# Patient Record
Sex: Female | Born: 1965 | Race: White | Hispanic: No | Marital: Married | Smoking: Never smoker
Health system: Southern US, Community
[De-identification: ages and names within clinical notes are randomized; demographics above are authoritative.]

## PROBLEM LIST (undated history)

## (undated) DIAGNOSIS — Z86010 Personal history of colonic polyps: Principal | ICD-10-CM

## (undated) DIAGNOSIS — F419 Anxiety disorder, unspecified: Secondary | ICD-10-CM

## (undated) DIAGNOSIS — F32A Depression, unspecified: Secondary | ICD-10-CM

## (undated) DIAGNOSIS — T7840XA Allergy, unspecified, initial encounter: Secondary | ICD-10-CM

## (undated) HISTORY — DX: Allergy, unspecified, initial encounter: T78.40XA

## (undated) HISTORY — DX: Personal history of colonic polyps: Z86.010

## (undated) HISTORY — PX: COLONOSCOPY: SHX174

## (undated) HISTORY — PX: DILATION AND CURETTAGE OF UTERUS: SHX78

## (undated) HISTORY — DX: Depression, unspecified: F32.A

## (undated) HISTORY — DX: Anxiety disorder, unspecified: F41.9

---

## 1998-03-19 ENCOUNTER — Inpatient Hospital Stay (HOSPITAL_COMMUNITY): Admission: AD | Admit: 1998-03-19 | Discharge: 1998-03-19 | Payer: Self-pay | Admitting: Obstetrics and Gynecology

## 1999-05-09 ENCOUNTER — Other Ambulatory Visit: Admission: RE | Admit: 1999-05-09 | Discharge: 1999-05-09 | Payer: Self-pay | Admitting: Obstetrics and Gynecology

## 1999-09-25 ENCOUNTER — Inpatient Hospital Stay (HOSPITAL_COMMUNITY): Admission: AD | Admit: 1999-09-25 | Discharge: 1999-09-25 | Payer: Self-pay | Admitting: Obstetrics and Gynecology

## 1999-11-21 ENCOUNTER — Inpatient Hospital Stay (HOSPITAL_COMMUNITY): Admission: AD | Admit: 1999-11-21 | Discharge: 1999-11-24 | Payer: Self-pay | Admitting: Obstetrics and Gynecology

## 1999-12-26 ENCOUNTER — Other Ambulatory Visit: Admission: RE | Admit: 1999-12-26 | Discharge: 1999-12-26 | Payer: Self-pay | Admitting: Obstetrics and Gynecology

## 2001-02-26 ENCOUNTER — Ambulatory Visit (HOSPITAL_COMMUNITY): Admission: RE | Admit: 2001-02-26 | Discharge: 2001-02-26 | Payer: Self-pay | Admitting: Obstetrics and Gynecology

## 2001-06-17 ENCOUNTER — Ambulatory Visit (HOSPITAL_COMMUNITY): Admission: RE | Admit: 2001-06-17 | Discharge: 2001-06-17 | Payer: Self-pay | Admitting: Obstetrics and Gynecology

## 2001-06-17 ENCOUNTER — Encounter: Payer: Self-pay | Admitting: Obstetrics and Gynecology

## 2001-12-21 ENCOUNTER — Other Ambulatory Visit: Admission: RE | Admit: 2001-12-21 | Discharge: 2001-12-21 | Payer: Self-pay | Admitting: Obstetrics and Gynecology

## 2002-07-02 ENCOUNTER — Inpatient Hospital Stay (HOSPITAL_COMMUNITY): Admission: AD | Admit: 2002-07-02 | Discharge: 2002-07-02 | Payer: Self-pay | Admitting: Obstetrics and Gynecology

## 2002-07-08 ENCOUNTER — Inpatient Hospital Stay (HOSPITAL_COMMUNITY): Admission: AD | Admit: 2002-07-08 | Discharge: 2002-07-11 | Payer: Self-pay | Admitting: Obstetrics and Gynecology

## 2002-08-18 ENCOUNTER — Other Ambulatory Visit: Admission: RE | Admit: 2002-08-18 | Discharge: 2002-08-18 | Payer: Self-pay | Admitting: Obstetrics and Gynecology

## 2003-09-21 ENCOUNTER — Other Ambulatory Visit: Admission: RE | Admit: 2003-09-21 | Discharge: 2003-09-21 | Payer: Self-pay | Admitting: Obstetrics and Gynecology

## 2004-11-12 ENCOUNTER — Other Ambulatory Visit: Admission: RE | Admit: 2004-11-12 | Discharge: 2004-11-12 | Payer: Self-pay | Admitting: Obstetrics and Gynecology

## 2005-11-28 ENCOUNTER — Other Ambulatory Visit: Admission: RE | Admit: 2005-11-28 | Discharge: 2005-11-28 | Payer: Self-pay | Admitting: Obstetrics and Gynecology

## 2014-06-27 ENCOUNTER — Other Ambulatory Visit: Payer: Self-pay | Admitting: Obstetrics and Gynecology

## 2014-06-27 DIAGNOSIS — R928 Other abnormal and inconclusive findings on diagnostic imaging of breast: Secondary | ICD-10-CM

## 2014-07-05 ENCOUNTER — Ambulatory Visit
Admission: RE | Admit: 2014-07-05 | Discharge: 2014-07-05 | Disposition: A | Discharge status: Home / Self Care | Payer: PRIVATE HEALTH INSURANCE | Source: Ambulatory Visit | Attending: Obstetrics and Gynecology | Admitting: Obstetrics and Gynecology

## 2014-07-05 ENCOUNTER — Encounter (INDEPENDENT_AMBULATORY_CARE_PROVIDER_SITE_OTHER): Payer: Self-pay

## 2014-07-05 DIAGNOSIS — R928 Other abnormal and inconclusive findings on diagnostic imaging of breast: Secondary | ICD-10-CM

## 2015-02-19 ENCOUNTER — Other Ambulatory Visit: Payer: Self-pay | Admitting: Obstetrics and Gynecology

## 2015-02-19 ENCOUNTER — Encounter: Payer: Self-pay | Admitting: Internal Medicine

## 2015-02-20 LAB — CYTOLOGY - PAP

## 2015-02-26 ENCOUNTER — Encounter: Payer: Self-pay | Admitting: Internal Medicine

## 2015-04-17 ENCOUNTER — Ambulatory Visit (AMBULATORY_SURGERY_CENTER): Payer: Self-pay | Admitting: *Deleted

## 2015-04-17 VITALS — Ht 68.0 in | Wt 126.0 lb

## 2015-04-17 DIAGNOSIS — Z1211 Encounter for screening for malignant neoplasm of colon: Secondary | ICD-10-CM

## 2015-04-17 NOTE — Progress Notes (Signed)
No egg or soy allergy. No anesthesia problems.  No home o2.  No diet meds.  

## 2015-05-01 ENCOUNTER — Encounter: Payer: PRIVATE HEALTH INSURANCE | Admitting: Internal Medicine

## 2015-05-02 ENCOUNTER — Ambulatory Visit: Payer: PRIVATE HEALTH INSURANCE | Admitting: Internal Medicine

## 2015-05-17 ENCOUNTER — Ambulatory Visit (AMBULATORY_SURGERY_CENTER): Payer: PRIVATE HEALTH INSURANCE | Admitting: Internal Medicine

## 2015-05-17 ENCOUNTER — Encounter: Payer: Self-pay | Admitting: Internal Medicine

## 2015-05-17 VITALS — BP 123/80 | HR 81 | Temp 98.5°F | Resp 17 | Ht 68.0 in | Wt 126.0 lb

## 2015-05-17 DIAGNOSIS — D122 Benign neoplasm of ascending colon: Secondary | ICD-10-CM | POA: Diagnosis not present

## 2015-05-17 DIAGNOSIS — Z1211 Encounter for screening for malignant neoplasm of colon: Secondary | ICD-10-CM | POA: Diagnosis not present

## 2015-05-17 MED ORDER — SODIUM CHLORIDE 0.9 % IV SOLN: 500.0000 mL | INTRAVENOUS | Status: DC

## 2015-05-17 NOTE — Patient Instructions (Addendum)
I found a 2 mm polyp and removed it.  All else normal.  I appreciate the opportunity to care for you. Gatha Mayer, MD, FACG   YOU HAD AN ENDOSCOPIC PROCEDURE TODAY AT [facility]:   Refer to the procedure report that was given to you for any specific questions about what was found during the examination.  If the procedure report does not answer your questions, please call your gastroenterologist to clarify.  If you requested that your care partner not be given the details of your procedure findings, then the procedure report has been included in a sealed envelope for you to review at your convenience later.  YOU SHOULD EXPECT: Some feelings of bloating in the abdomen. Passage of more gas than usual.  Walking can help get rid of the air that was put into your GI tract during the procedure and reduce the bloating. If you had a lower endoscopy (such as a colonoscopy or flexible sigmoidoscopy) you may notice spotting of blood in your stool or on the toilet paper. If you underwent a bowel prep for your procedure, you may not have a normal bowel movement for a few days.  Please Note:  You might notice some irritation and congestion in your nose or some drainage.  This is from the oxygen used during your procedure.  There is no need for concern and it should clear up in a day or so.  SYMPTOMS TO REPORT IMMEDIATELY:   Following lower endoscopy (colonoscopy or flexible sigmoidoscopy):  Excessive amounts of blood in the stool  Significant tenderness or worsening of abdominal pains  Swelling of the abdomen that is new, acute  Fever of 100F or higher    For urgent or emergent issues, a gastroenterologist can be reached at any hour by calling [phone].   DIET: Your first meal following the procedure should be a small meal and then it is ok to progress to your normal diet. Heavy or fried foods are harder to digest and may make you feel nauseous or bloated.   Likewise, meals heavy in dairy and vegetables can increase bloating.  Drink plenty of fluids but you should avoid alcoholic beverages for 24 hours.  ACTIVITY:  You should plan to take it easy for the rest of today and you should NOT DRIVE or use heavy machinery until tomorrow (because of the sedation medicines used during the test).    FOLLOW UP: Our staff will call the number listed on your records the next business day following your procedure to check on you and address any questions or concerns that you may have regarding the information given to you following your procedure. If we do not reach you, we will leave a message.  However, if you are feeling well and you are not experiencing any problems, there is no need to return our call.  We will assume that you have returned to your regular daily activities without incident.  If any biopsies were taken you will be contacted by phone or by letter within the next 1-3 weeks.  Please call us at [phone] if you have not heard about the biopsies in 3 weeks.    SIGNATURES/CONFIDENTIALITY: You and/or your care partner have signed paperwork which will be entered into your electronic medical record.  These signatures attest to the fact that that the information above on your After Visit Summary has been reviewed and is understood.  Full responsibility of the confidentiality of this discharge information  lies with you and/or your care-partner.   INFORMATION ON POLYPS GIVEN TO YOU TODAY

## 2015-05-17 NOTE — Progress Notes (Signed)
Transferred to recovery room. A/O x3, pleased with MAC.  VSS.  Report to Penny, RN. 

## 2015-05-17 NOTE — Progress Notes (Signed)
Called to room to assist during endoscopic procedure.  Patient ID and intended procedure confirmed with present staff. Received instructions for my participation in the procedure from the performing physician.  

## 2015-05-17 NOTE — Op Note (Signed)
[place]  [address]. [place], [address]   COLONOSCOPY PROCEDURE REPORT  PATIENT: Diane Stewart, Diane Stewart  MR#: [id number] BIRTHDATE: 12-31-1965 , 49  yrs. old GENDER: female ENDOSCOPIST: Gatha Mayer, MD, [facility] PROCEDURE DATE:  05/17/2015 PROCEDURE:   Colonoscopy, screening and Colonoscopy with biopsy First Screening Colonoscopy - Avg.  risk and is 50 yrs.  old or older Yes.  Prior Negative Screening - Now for repeat screening. N/A  History of Adenoma - Now for follow-up colonoscopy & has been > or = to 3 yrs.  N/A  Polyps removed today? Yes ASA CLASS:   Class I INDICATIONS:Screening for colonic neoplasia and Colorectal Neoplasm Risk Assessment for this procedure is average risk. MEDICATIONS: Propofol 300 mg IV and Monitored anesthesia care  DESCRIPTION OF PROCEDURE:   After the risks benefits and alternatives of the procedure were thoroughly explained, informed consent was obtained.  The digital rectal exam revealed no abnormalities of the rectum.   The LB [id number]  endoscope was introduced through the anus and advanced to the cecum, which was identified by both the appendix and ileocecal valve. No adverse events experienced.   The quality of the prep was excellent. (MiraLax was used)  The instrument was then slowly withdrawn as the colon was fully examined. Estimated blood loss is zero unless otherwise noted in this procedure report.      COLON FINDINGS: A sessile polyp measuring 2 mm in size was found in the ascending colon.  A polypectomy was performed with cold forceps.  The resection was complete, the polyp tissue was completely retrieved and sent to histology.   The examination was otherwise normal.  Retroflexed views revealed no abnormalities. The time to cecum = 7.7 Withdrawal time = 8.1   The scope was withdrawn and the procedure completed. COMPLICATIONS: There were no immediate complications.  ENDOSCOPIC IMPRESSION: 1.   Sessile polyp  was found in the ascending colon; polypectomy was performed with cold forceps 2.   The examination was otherwise normal - excellent prep - first screening  RECOMMENDATIONS: Timing of repeat colonoscopy will be determined by pathology findings.  eSigned:  Gatha Mayer, MD, [facility] 05/17/2015 10:37 AM   cc: Dr. Arvella Nigh and The Patient

## 2015-05-18 ENCOUNTER — Telehealth: Payer: Self-pay | Admitting: *Deleted

## 2015-05-18 NOTE — Telephone Encounter (Signed)
  Follow up Call-  Call back number 05/17/2015  Post procedure Call Back phone  # [phone]  Permission to leave phone message Yes     Patient questions:  Do you have a fever, pain , or abdominal swelling? No. Pain Score  0 *  Have you tolerated food without any problems? Yes.    Have you been able to return to your normal activities? Yes.    Do you have any questions about your discharge instructions: Diet   No. Medications  No. Follow up visit  No.  Do you have questions or concerns about your Care? No.  Actions: * If pain score is 4 or above: No action needed, pain <4.

## 2015-05-23 ENCOUNTER — Other Ambulatory Visit: Payer: Self-pay

## 2015-05-23 DIAGNOSIS — Z1231 Encounter for screening mammogram for malignant neoplasm of breast: Secondary | ICD-10-CM

## 2015-05-27 ENCOUNTER — Encounter: Payer: Self-pay | Admitting: Internal Medicine

## 2015-05-27 DIAGNOSIS — Z86010 Personal history of colonic polyps: Secondary | ICD-10-CM

## 2015-05-27 DIAGNOSIS — Z860101 Personal history of adenomatous and serrated colon polyps: Secondary | ICD-10-CM

## 2015-05-27 HISTORY — DX: Personal history of adenomatous and serrated colon polyps: Z86.0101

## 2015-05-27 HISTORY — DX: Personal history of colonic polyps: Z86.010

## 2015-05-27 NOTE — Progress Notes (Signed)
Quick Note:  2 mm adenoma Colon recall 5-55yrs (2021-23) ______

## 2015-07-10 ENCOUNTER — Ambulatory Visit
Admission: RE | Admit: 2015-07-10 | Discharge: 2015-07-10 | Disposition: A | Discharge status: Home / Self Care | Payer: PRIVATE HEALTH INSURANCE | Source: Ambulatory Visit

## 2015-07-10 DIAGNOSIS — Z1231 Encounter for screening mammogram for malignant neoplasm of breast: Secondary | ICD-10-CM

## 2016-06-16 ENCOUNTER — Other Ambulatory Visit: Payer: Self-pay | Admitting: Obstetrics and Gynecology

## 2016-06-16 DIAGNOSIS — Z1231 Encounter for screening mammogram for malignant neoplasm of breast: Secondary | ICD-10-CM

## 2016-07-21 ENCOUNTER — Ambulatory Visit
Admission: RE | Admit: 2016-07-21 | Discharge: 2016-07-21 | Disposition: A | Discharge status: Home / Self Care | Payer: PRIVATE HEALTH INSURANCE | Source: Ambulatory Visit | Attending: Obstetrics and Gynecology | Admitting: Obstetrics and Gynecology

## 2016-07-21 DIAGNOSIS — Z1231 Encounter for screening mammogram for malignant neoplasm of breast: Secondary | ICD-10-CM

## 2016-10-24 DIAGNOSIS — H6062 Unspecified chronic otitis externa, left ear: Secondary | ICD-10-CM | POA: Diagnosis not present

## 2016-10-24 DIAGNOSIS — L409 Psoriasis, unspecified: Secondary | ICD-10-CM | POA: Diagnosis not present

## 2016-12-23 ENCOUNTER — Ambulatory Visit (INDEPENDENT_AMBULATORY_CARE_PROVIDER_SITE_OTHER): Payer: 59 | Admitting: Licensed Clinical Social Worker

## 2016-12-23 DIAGNOSIS — F4323 Adjustment disorder with mixed anxiety and depressed mood: Secondary | ICD-10-CM | POA: Diagnosis not present

## 2017-01-12 ENCOUNTER — Ambulatory Visit (INDEPENDENT_AMBULATORY_CARE_PROVIDER_SITE_OTHER): Payer: 59 | Admitting: Licensed Clinical Social Worker

## 2017-01-12 DIAGNOSIS — F4323 Adjustment disorder with mixed anxiety and depressed mood: Secondary | ICD-10-CM

## 2017-01-20 ENCOUNTER — Ambulatory Visit (INDEPENDENT_AMBULATORY_CARE_PROVIDER_SITE_OTHER): Payer: 59 | Admitting: Licensed Clinical Social Worker

## 2017-01-20 DIAGNOSIS — F4323 Adjustment disorder with mixed anxiety and depressed mood: Secondary | ICD-10-CM

## 2017-02-16 ENCOUNTER — Ambulatory Visit (INDEPENDENT_AMBULATORY_CARE_PROVIDER_SITE_OTHER): Payer: 59 | Admitting: Licensed Clinical Social Worker

## 2017-02-16 DIAGNOSIS — F419 Anxiety disorder, unspecified: Secondary | ICD-10-CM | POA: Diagnosis not present

## 2017-02-23 ENCOUNTER — Ambulatory Visit (INDEPENDENT_AMBULATORY_CARE_PROVIDER_SITE_OTHER): Payer: 59 | Admitting: Licensed Clinical Social Worker

## 2017-02-23 DIAGNOSIS — F419 Anxiety disorder, unspecified: Secondary | ICD-10-CM | POA: Diagnosis not present

## 2017-03-02 DIAGNOSIS — Z681 Body mass index (BMI) 19 or less, adult: Secondary | ICD-10-CM | POA: Diagnosis not present

## 2017-03-02 DIAGNOSIS — Z01419 Encounter for gynecological examination (general) (routine) without abnormal findings: Secondary | ICD-10-CM | POA: Diagnosis not present

## 2017-03-30 ENCOUNTER — Ambulatory Visit (INDEPENDENT_AMBULATORY_CARE_PROVIDER_SITE_OTHER): Payer: 59 | Admitting: Licensed Clinical Social Worker

## 2017-03-30 DIAGNOSIS — F419 Anxiety disorder, unspecified: Secondary | ICD-10-CM | POA: Diagnosis not present

## 2017-05-07 ENCOUNTER — Ambulatory Visit (INDEPENDENT_AMBULATORY_CARE_PROVIDER_SITE_OTHER): Payer: 59 | Admitting: Licensed Clinical Social Worker

## 2017-05-07 DIAGNOSIS — F419 Anxiety disorder, unspecified: Secondary | ICD-10-CM

## 2017-05-27 ENCOUNTER — Ambulatory Visit (INDEPENDENT_AMBULATORY_CARE_PROVIDER_SITE_OTHER): Payer: 59 | Admitting: Licensed Clinical Social Worker

## 2017-05-27 DIAGNOSIS — F419 Anxiety disorder, unspecified: Secondary | ICD-10-CM | POA: Diagnosis not present

## 2017-06-10 DIAGNOSIS — E559 Vitamin D deficiency, unspecified: Secondary | ICD-10-CM | POA: Diagnosis not present

## 2017-06-10 DIAGNOSIS — Z1321 Encounter for screening for nutritional disorder: Secondary | ICD-10-CM | POA: Diagnosis not present

## 2017-06-10 DIAGNOSIS — Z13228 Encounter for screening for other metabolic disorders: Secondary | ICD-10-CM | POA: Diagnosis not present

## 2017-06-10 DIAGNOSIS — Z1329 Encounter for screening for other suspected endocrine disorder: Secondary | ICD-10-CM | POA: Diagnosis not present

## 2017-06-10 DIAGNOSIS — Z13220 Encounter for screening for lipoid disorders: Secondary | ICD-10-CM | POA: Diagnosis not present

## 2017-06-10 DIAGNOSIS — Z130 Encounter for screening for diseases of the blood and blood-forming organs and certain disorders involving the immune mechanism: Secondary | ICD-10-CM | POA: Diagnosis not present

## 2017-06-15 ENCOUNTER — Other Ambulatory Visit: Payer: Self-pay | Admitting: Obstetrics and Gynecology

## 2017-06-15 DIAGNOSIS — Z1231 Encounter for screening mammogram for malignant neoplasm of breast: Secondary | ICD-10-CM

## 2017-07-16 ENCOUNTER — Ambulatory Visit (INDEPENDENT_AMBULATORY_CARE_PROVIDER_SITE_OTHER): Payer: 59 | Admitting: Licensed Clinical Social Worker

## 2017-07-16 DIAGNOSIS — F419 Anxiety disorder, unspecified: Secondary | ICD-10-CM

## 2017-07-23 ENCOUNTER — Ambulatory Visit
Admission: RE | Admit: 2017-07-23 | Discharge: 2017-07-23 | Disposition: A | Discharge status: Home / Self Care | Payer: 59 | Source: Ambulatory Visit | Attending: Obstetrics and Gynecology | Admitting: Obstetrics and Gynecology

## 2017-07-23 DIAGNOSIS — Z1231 Encounter for screening mammogram for malignant neoplasm of breast: Secondary | ICD-10-CM

## 2017-08-10 ENCOUNTER — Ambulatory Visit: Payer: 59 | Admitting: Licensed Clinical Social Worker

## 2017-10-07 DIAGNOSIS — E559 Vitamin D deficiency, unspecified: Secondary | ICD-10-CM | POA: Diagnosis not present

## 2017-11-18 DIAGNOSIS — L400 Psoriasis vulgaris: Secondary | ICD-10-CM | POA: Diagnosis not present

## 2017-11-18 DIAGNOSIS — D224 Melanocytic nevi of scalp and neck: Secondary | ICD-10-CM | POA: Diagnosis not present

## 2017-11-18 DIAGNOSIS — D2272 Melanocytic nevi of left lower limb, including hip: Secondary | ICD-10-CM | POA: Diagnosis not present

## 2017-11-18 DIAGNOSIS — D1801 Hemangioma of skin and subcutaneous tissue: Secondary | ICD-10-CM | POA: Diagnosis not present

## 2017-11-18 DIAGNOSIS — D2271 Melanocytic nevi of right lower limb, including hip: Secondary | ICD-10-CM | POA: Diagnosis not present

## 2017-11-18 DIAGNOSIS — C44519 Basal cell carcinoma of skin of other part of trunk: Secondary | ICD-10-CM | POA: Diagnosis not present

## 2017-11-18 DIAGNOSIS — L820 Inflamed seborrheic keratosis: Secondary | ICD-10-CM | POA: Diagnosis not present

## 2017-11-18 DIAGNOSIS — D225 Melanocytic nevi of trunk: Secondary | ICD-10-CM | POA: Diagnosis not present

## 2017-11-18 DIAGNOSIS — D2261 Melanocytic nevi of right upper limb, including shoulder: Secondary | ICD-10-CM | POA: Diagnosis not present

## 2017-11-18 DIAGNOSIS — D485 Neoplasm of uncertain behavior of skin: Secondary | ICD-10-CM | POA: Diagnosis not present

## 2017-11-18 DIAGNOSIS — L821 Other seborrheic keratosis: Secondary | ICD-10-CM | POA: Diagnosis not present

## 2018-01-11 DIAGNOSIS — H524 Presbyopia: Secondary | ICD-10-CM | POA: Diagnosis not present

## 2018-03-03 DIAGNOSIS — Z01419 Encounter for gynecological examination (general) (routine) without abnormal findings: Secondary | ICD-10-CM | POA: Diagnosis not present

## 2018-03-03 DIAGNOSIS — Z681 Body mass index (BMI) 19 or less, adult: Secondary | ICD-10-CM | POA: Diagnosis not present

## 2018-06-15 ENCOUNTER — Other Ambulatory Visit: Payer: Self-pay | Admitting: Obstetrics and Gynecology

## 2018-06-15 DIAGNOSIS — Z1231 Encounter for screening mammogram for malignant neoplasm of breast: Secondary | ICD-10-CM

## 2018-06-25 ENCOUNTER — Ambulatory Visit: Payer: Self-pay | Admitting: Nurse Practitioner

## 2018-06-25 DIAGNOSIS — Z23 Encounter for immunization: Secondary | ICD-10-CM

## 2018-07-26 ENCOUNTER — Ambulatory Visit
Admission: RE | Admit: 2018-07-26 | Discharge: 2018-07-26 | Disposition: A | Discharge status: Home / Self Care | Payer: 59 | Source: Ambulatory Visit | Attending: Obstetrics and Gynecology | Admitting: Obstetrics and Gynecology

## 2018-07-26 DIAGNOSIS — Z1231 Encounter for screening mammogram for malignant neoplasm of breast: Secondary | ICD-10-CM | POA: Diagnosis not present

## 2018-11-22 DIAGNOSIS — D2272 Melanocytic nevi of left lower limb, including hip: Secondary | ICD-10-CM | POA: Diagnosis not present

## 2018-11-22 DIAGNOSIS — D2261 Melanocytic nevi of right upper limb, including shoulder: Secondary | ICD-10-CM | POA: Diagnosis not present

## 2018-11-22 DIAGNOSIS — D225 Melanocytic nevi of trunk: Secondary | ICD-10-CM | POA: Diagnosis not present

## 2018-11-22 DIAGNOSIS — D2262 Melanocytic nevi of left upper limb, including shoulder: Secondary | ICD-10-CM | POA: Diagnosis not present

## 2018-11-22 DIAGNOSIS — D2271 Melanocytic nevi of right lower limb, including hip: Secondary | ICD-10-CM | POA: Diagnosis not present

## 2018-11-22 DIAGNOSIS — L400 Psoriasis vulgaris: Secondary | ICD-10-CM | POA: Diagnosis not present

## 2018-11-22 DIAGNOSIS — D2371 Other benign neoplasm of skin of right lower limb, including hip: Secondary | ICD-10-CM | POA: Diagnosis not present

## 2018-11-22 MED FILL — TRIAMCINOLONE 0.025% CREAM: 0.025 | 10 days supply | Qty: 15 | Fill #0

## 2019-05-05 DIAGNOSIS — Z681 Body mass index (BMI) 19 or less, adult: Secondary | ICD-10-CM | POA: Diagnosis not present

## 2019-05-05 DIAGNOSIS — Z01419 Encounter for gynecological examination (general) (routine) without abnormal findings: Secondary | ICD-10-CM | POA: Diagnosis not present

## 2019-06-22 DIAGNOSIS — Z23 Encounter for immunization: Secondary | ICD-10-CM | POA: Diagnosis not present

## 2019-06-23 ENCOUNTER — Other Ambulatory Visit: Payer: Self-pay | Admitting: Obstetrics and Gynecology

## 2019-06-23 DIAGNOSIS — Z1231 Encounter for screening mammogram for malignant neoplasm of breast: Secondary | ICD-10-CM

## 2019-08-01 ENCOUNTER — Ambulatory Visit: Payer: 59

## 2019-10-13 DIAGNOSIS — N390 Urinary tract infection, site not specified: Secondary | ICD-10-CM | POA: Diagnosis not present

## 2019-10-13 DIAGNOSIS — R309 Painful micturition, unspecified: Secondary | ICD-10-CM | POA: Diagnosis not present

## 2019-10-13 DIAGNOSIS — R3915 Urgency of urination: Secondary | ICD-10-CM | POA: Diagnosis not present

## 2019-10-13 MED FILL — NITROFURANTOIN MONO-MCR 100: 100 | 5 days supply | Qty: 10 | Fill #0

## 2019-12-23 ENCOUNTER — Ambulatory Visit: Payer: 59 | Attending: Internal Medicine

## 2019-12-23 DIAGNOSIS — Z23 Encounter for immunization: Secondary | ICD-10-CM

## 2019-12-23 NOTE — Progress Notes (Signed)
   Covid-19 Vaccination Clinic  Name:  Diane Stewart    MRN: [id number] DOB: [birth year]  12/23/2019  [name] was observed post Covid-19 immunization for 15 minutes without incident. She was provided with Vaccine Information Sheet and instruction to access the V-Safe system.   [name] was instructed to call 911 with any severe reactions post vaccine: Marland Kitchen Difficulty breathing  . Swelling of face and throat  . A fast heartbeat  . A bad rash all over body  . Dizziness and weakness   Immunizations Administered    Name Date Dose VIS Date Route   Pfizer COVID-19 Vaccine 12/23/2019  3:56 PM 0.3 mL 08/26/2019 Intramuscular   Manufacturer: [place]   Lot: [id number]   [place]: [id number]

## 2020-01-18 ENCOUNTER — Ambulatory Visit: Payer: 59 | Attending: Internal Medicine

## 2020-01-18 DIAGNOSIS — Z23 Encounter for immunization: Secondary | ICD-10-CM

## 2020-01-18 NOTE — Progress Notes (Signed)
   Covid-19 Vaccination Clinic  Name:  Diane Stewart    MRN: [id number] DOB: 1966/04/27  01/18/2020  Diane Stewart was observed post Covid-19 immunization for 15 minutes without incident. She was provided with Vaccine Information Sheet and instruction to access the V-Safe system.   Diane Stewart was instructed to call 911 with any severe reactions post vaccine: Marland Kitchen Difficulty breathing  . Swelling of face and throat  . A fast heartbeat  . A bad rash all over body  . Dizziness and weakness   Immunizations Administered    Name Date Dose VIS Date Route   Pfizer COVID-19 Vaccine 01/18/2020 12:26 PM 0.3 mL 11/09/2018 Intramuscular   Manufacturer: [place]   Lot: [id number]   [place]: [id number]

## 2020-02-08 DIAGNOSIS — H52203 Unspecified astigmatism, bilateral: Secondary | ICD-10-CM | POA: Diagnosis not present

## 2020-02-08 DIAGNOSIS — H524 Presbyopia: Secondary | ICD-10-CM | POA: Diagnosis not present

## 2020-03-01 ENCOUNTER — Other Ambulatory Visit: Payer: Self-pay

## 2020-03-01 ENCOUNTER — Ambulatory Visit
Admission: RE | Admit: 2020-03-01 | Discharge: 2020-03-01 | Disposition: A | Discharge status: Home / Self Care | Payer: 59 | Source: Ambulatory Visit | Attending: Obstetrics and Gynecology | Admitting: Obstetrics and Gynecology

## 2020-03-01 DIAGNOSIS — Z1231 Encounter for screening mammogram for malignant neoplasm of breast: Secondary | ICD-10-CM | POA: Diagnosis not present

## 2020-06-06 DIAGNOSIS — D2261 Melanocytic nevi of right upper limb, including shoulder: Secondary | ICD-10-CM | POA: Diagnosis not present

## 2020-06-06 DIAGNOSIS — L400 Psoriasis vulgaris: Secondary | ICD-10-CM | POA: Diagnosis not present

## 2020-06-06 DIAGNOSIS — L720 Epidermal cyst: Secondary | ICD-10-CM | POA: Diagnosis not present

## 2020-06-06 DIAGNOSIS — L821 Other seborrheic keratosis: Secondary | ICD-10-CM | POA: Diagnosis not present

## 2020-06-06 DIAGNOSIS — Z85828 Personal history of other malignant neoplasm of skin: Secondary | ICD-10-CM | POA: Diagnosis not present

## 2020-06-06 DIAGNOSIS — D2372 Other benign neoplasm of skin of left lower limb, including hip: Secondary | ICD-10-CM | POA: Diagnosis not present

## 2020-06-06 DIAGNOSIS — D225 Melanocytic nevi of trunk: Secondary | ICD-10-CM | POA: Diagnosis not present

## 2020-06-06 DIAGNOSIS — L438 Other lichen planus: Secondary | ICD-10-CM | POA: Diagnosis not present

## 2020-06-06 DIAGNOSIS — D2272 Melanocytic nevi of left lower limb, including hip: Secondary | ICD-10-CM | POA: Diagnosis not present

## 2020-06-11 ENCOUNTER — Encounter: Payer: Self-pay | Admitting: Internal Medicine

## 2020-07-09 DIAGNOSIS — Z20822 Contact with and (suspected) exposure to covid-19: Secondary | ICD-10-CM | POA: Diagnosis not present

## 2020-09-11 DIAGNOSIS — R87615 Unsatisfactory cytologic smear of cervix: Secondary | ICD-10-CM | POA: Diagnosis not present

## 2020-09-11 DIAGNOSIS — Z13820 Encounter for screening for osteoporosis: Secondary | ICD-10-CM | POA: Diagnosis not present

## 2020-09-11 DIAGNOSIS — Z681 Body mass index (BMI) 19 or less, adult: Secondary | ICD-10-CM | POA: Diagnosis not present

## 2020-09-11 DIAGNOSIS — Z01419 Encounter for gynecological examination (general) (routine) without abnormal findings: Secondary | ICD-10-CM | POA: Diagnosis not present

## 2020-10-16 DIAGNOSIS — Z01419 Encounter for gynecological examination (general) (routine) without abnormal findings: Secondary | ICD-10-CM | POA: Diagnosis not present

## 2020-10-24 DIAGNOSIS — Z13220 Encounter for screening for lipoid disorders: Secondary | ICD-10-CM | POA: Diagnosis not present

## 2020-10-24 DIAGNOSIS — Z13228 Encounter for screening for other metabolic disorders: Secondary | ICD-10-CM | POA: Diagnosis not present

## 2020-10-24 DIAGNOSIS — Z1329 Encounter for screening for other suspected endocrine disorder: Secondary | ICD-10-CM | POA: Diagnosis not present

## 2020-10-24 DIAGNOSIS — N83202 Unspecified ovarian cyst, left side: Secondary | ICD-10-CM | POA: Diagnosis not present

## 2020-10-24 DIAGNOSIS — Z1321 Encounter for screening for nutritional disorder: Secondary | ICD-10-CM | POA: Diagnosis not present

## 2020-12-10 DIAGNOSIS — E559 Vitamin D deficiency, unspecified: Secondary | ICD-10-CM | POA: Diagnosis not present

## 2021-01-24 ENCOUNTER — Other Ambulatory Visit: Payer: Self-pay | Admitting: Obstetrics and Gynecology

## 2021-01-24 DIAGNOSIS — Z1231 Encounter for screening mammogram for malignant neoplasm of breast: Secondary | ICD-10-CM

## 2021-03-22 ENCOUNTER — Ambulatory Visit
Admission: RE | Admit: 2021-03-22 | Discharge: 2021-03-22 | Disposition: A | Discharge status: Home / Self Care | Payer: 59 | Source: Ambulatory Visit | Attending: Obstetrics and Gynecology | Admitting: Obstetrics and Gynecology

## 2021-03-22 ENCOUNTER — Other Ambulatory Visit: Payer: Self-pay

## 2021-03-22 DIAGNOSIS — Z1231 Encounter for screening mammogram for malignant neoplasm of breast: Secondary | ICD-10-CM

## 2021-09-24 IMAGING — MG MM DIGITAL SCREENING BILAT W/ TOMO AND CAD
8 series · 9 of 24 positions shown · non-contrast
Comparison: Previous exam(s).

CLINICAL DATA: Screening.

EXAM:
DIGITAL SCREENING BILATERAL MAMMOGRAM WITH TOMOSYNTHESIS AND CAD
TECHNIQUE: Bilateral screening digital craniocaudal and mediolateral oblique
mammograms were obtained. Bilateral screening digital breast
tomosynthesis was performed. The images were evaluated with
computer-aided detection.

[L MLO synth-2D]
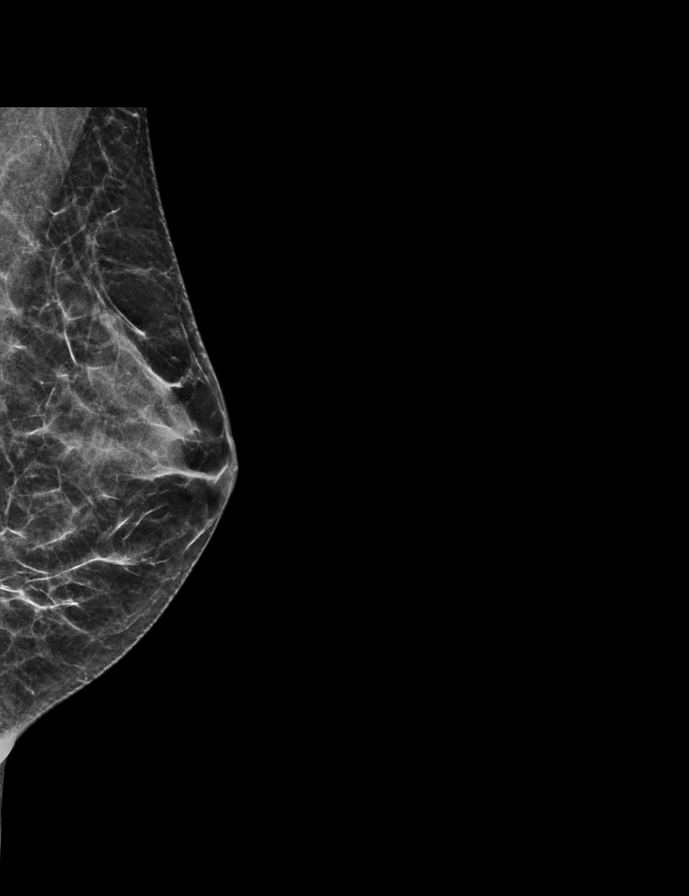

[R CC synth-2D]
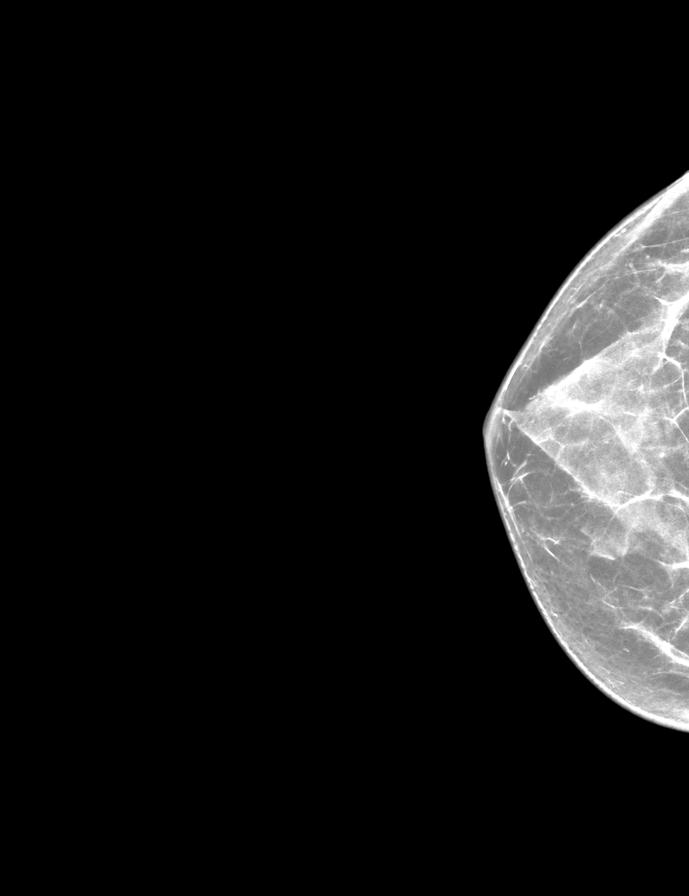

[R MLO synth-2D]
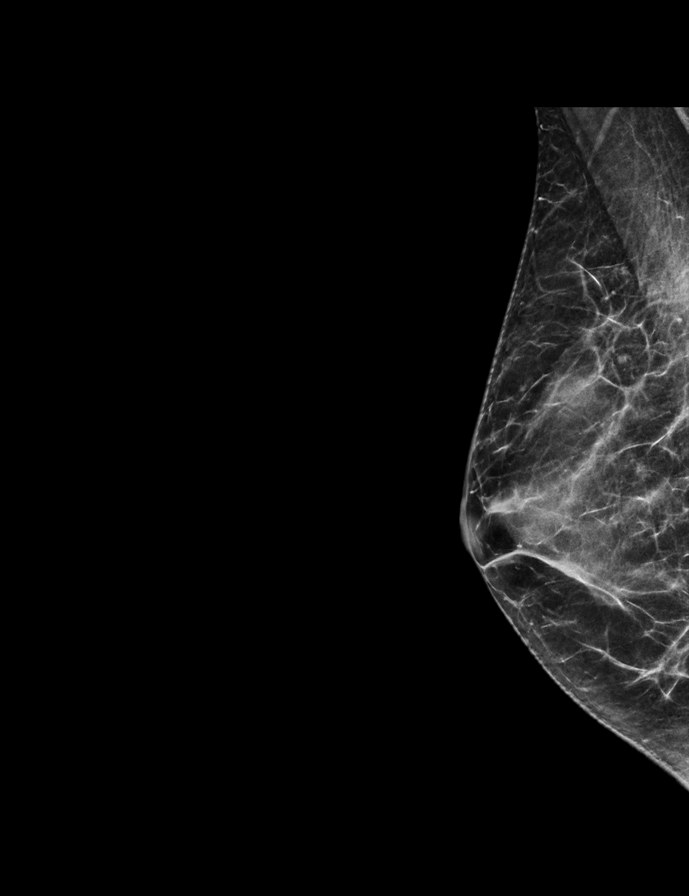

[L CC synth-2D]
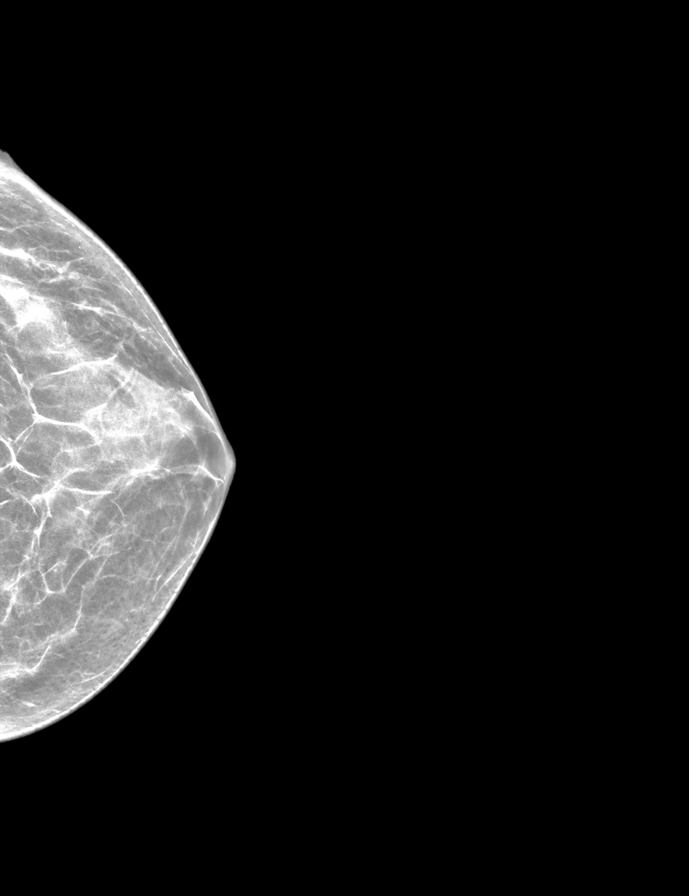

[L CC tomo · 2 of 47 frames shown]
[frame 16/47]
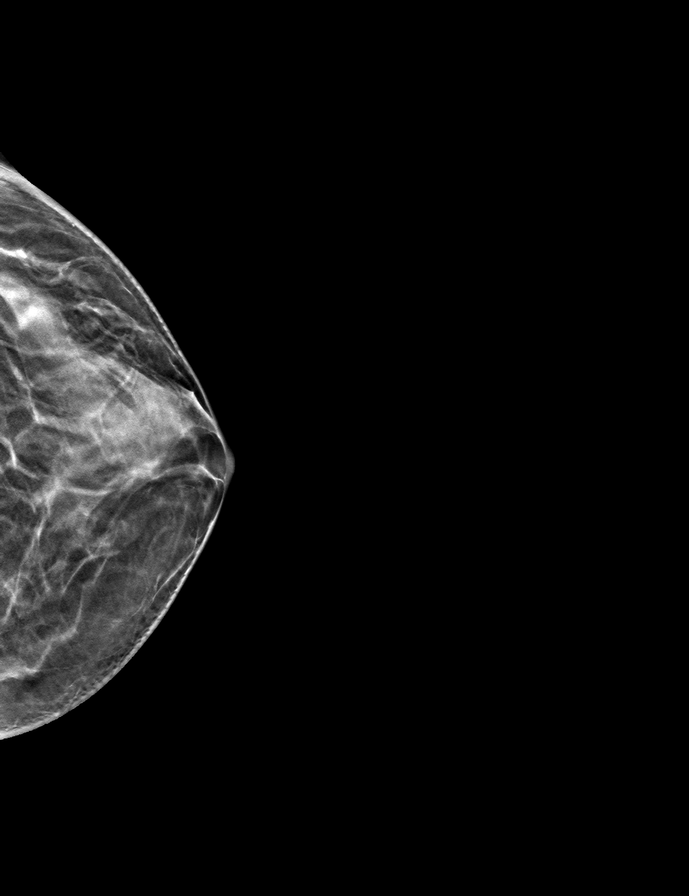
[frame 24/47]
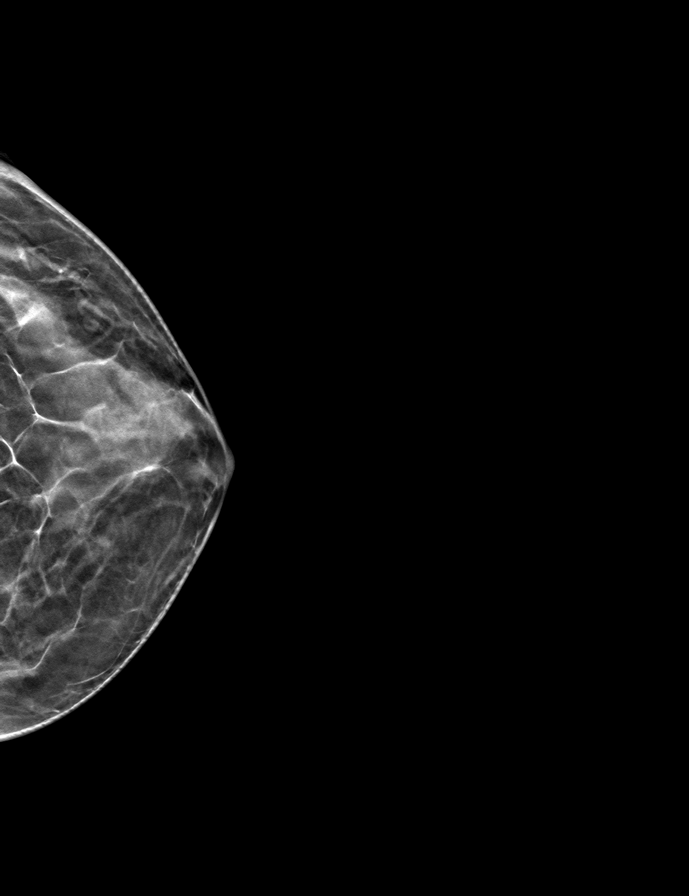

[R CC tomo · tomo slice 27/53.0]
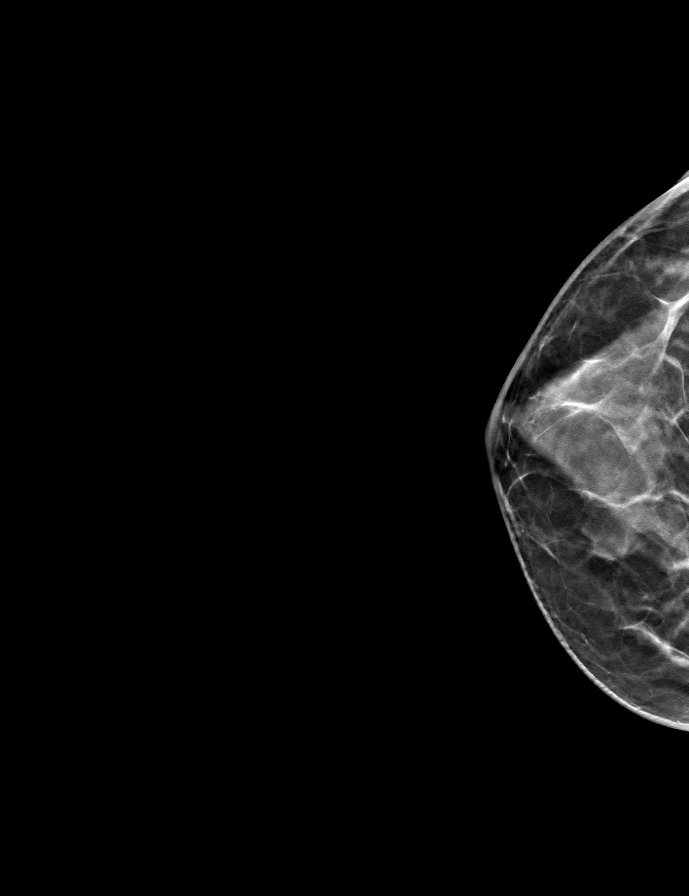

[R MLO tomo · tomo slice 25/49.0]
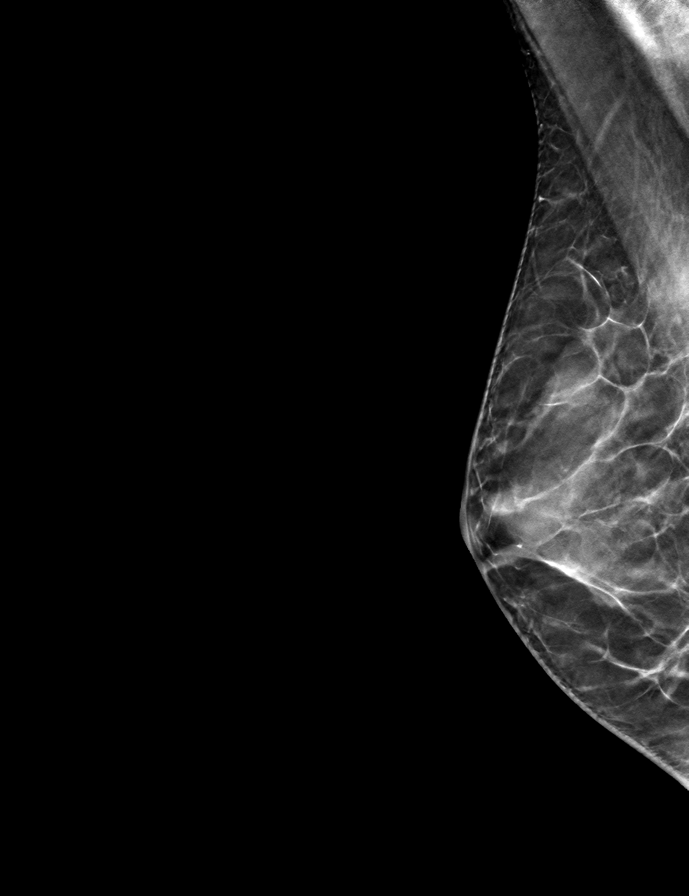

[L MLO tomo · tomo slice 24/47.0]
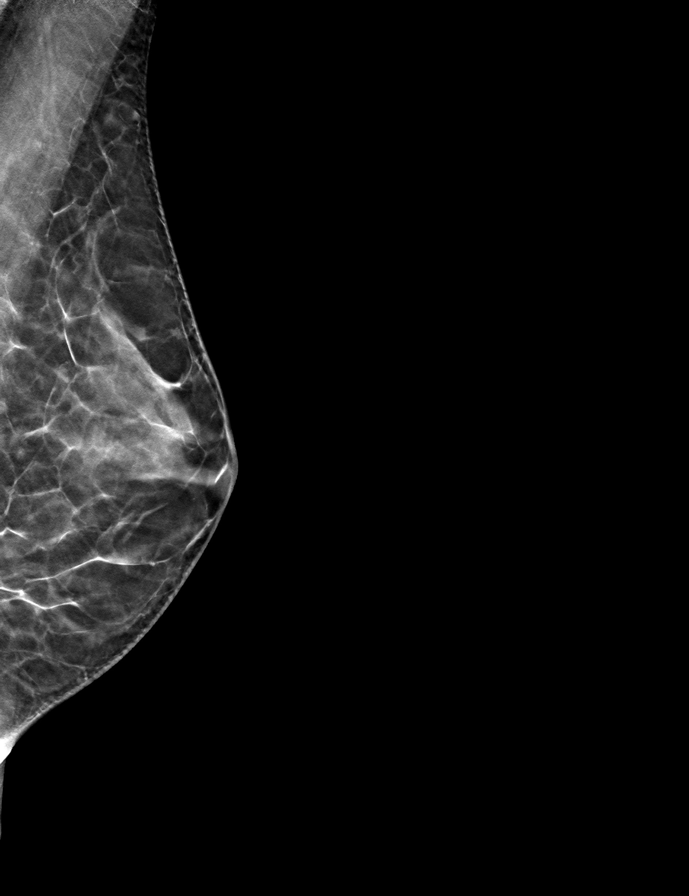

[9 of 24 positions shown; findings below may reference images not displayed]

ACR Breast Density Category c: The breast tissue is heterogeneously
dense, which may obscure small masses.
FINDINGS: There are no findings suspicious for malignancy.
IMPRESSION: No mammographic evidence of malignancy. A result letter of this
screening mammogram will be mailed directly to the patient.

RECOMMENDATION:
Screening mammogram in one year. (Code:Q3-W-BC3)

BI-RADS CATEGORY  1: Negative.

## 2022-02-14 ENCOUNTER — Other Ambulatory Visit: Payer: Self-pay | Admitting: Obstetrics and Gynecology

## 2022-02-14 DIAGNOSIS — Z1231 Encounter for screening mammogram for malignant neoplasm of breast: Secondary | ICD-10-CM

## 2022-03-27 ENCOUNTER — Ambulatory Visit
Admission: RE | Admit: 2022-03-27 | Discharge: 2022-03-27 | Disposition: A | Discharge status: Home / Self Care | Payer: 59 | Source: Ambulatory Visit | Attending: Obstetrics and Gynecology | Admitting: Obstetrics and Gynecology

## 2022-03-27 DIAGNOSIS — Z1231 Encounter for screening mammogram for malignant neoplasm of breast: Secondary | ICD-10-CM

## 2022-04-15 ENCOUNTER — Encounter: Payer: Self-pay | Admitting: Internal Medicine

## 2022-06-04 ENCOUNTER — Ambulatory Visit (AMBULATORY_SURGERY_CENTER): Payer: 59 | Admitting: *Deleted

## 2022-06-04 VITALS — Ht 67.5 in | Wt 132.2 lb

## 2022-06-04 DIAGNOSIS — Z86010 Personal history of colonic polyps: Secondary | ICD-10-CM

## 2022-06-04 NOTE — Progress Notes (Signed)
No egg or soy allergy known to patient  No issues known to pt with past sedation with any surgeries or procedures Patient denies ever being told they had issues or difficulty with intubation  No FH of Malignant Hyperthermia Pt is not on diet pills Pt is not on  home 02  Pt is not on blood thinners  Pt denies issues with constipation  No A fib or A flutter Have any cardiac testing pending--NO Pt instructed to use Singlecare.com or GoodRx for a price reduction on prep    Sample sheet of over the counter items to purchase for prep given during pre-visit

## 2022-06-10 ENCOUNTER — Other Ambulatory Visit (HOSPITAL_COMMUNITY): Payer: Self-pay

## 2022-06-10 MED ORDER — CLOBETASOL PROPIONATE 0.05 % EX SOLN
CUTANEOUS | 2 refills | Status: DC
  Filled 2022-06-10: qty 25, 30d supply, fill #0

## 2022-06-11 ENCOUNTER — Other Ambulatory Visit (HOSPITAL_COMMUNITY): Payer: Self-pay

## 2022-06-12 ENCOUNTER — Encounter: Payer: Self-pay | Admitting: Internal Medicine

## 2022-06-25 ENCOUNTER — Encounter: Payer: 59 | Admitting: Internal Medicine

## 2022-08-14 ENCOUNTER — Ambulatory Visit (AMBULATORY_SURGERY_CENTER): Payer: 59 | Admitting: Internal Medicine

## 2022-08-14 ENCOUNTER — Encounter: Payer: Self-pay | Admitting: Internal Medicine

## 2022-08-14 VITALS — BP 119/71 | HR 74 | Temp 98.6°F | Resp 12 | Ht 67.5 in | Wt 132.2 lb

## 2022-08-14 DIAGNOSIS — Z09 Encounter for follow-up examination after completed treatment for conditions other than malignant neoplasm: Secondary | ICD-10-CM | POA: Diagnosis not present

## 2022-08-14 DIAGNOSIS — Z86010 Personal history of colonic polyps: Secondary | ICD-10-CM

## 2022-08-14 DIAGNOSIS — D125 Benign neoplasm of sigmoid colon: Secondary | ICD-10-CM | POA: Diagnosis not present

## 2022-08-14 MED ORDER — SODIUM CHLORIDE 0.9 % IV SOLN: 500.0000 mL | Freq: Once | INTRAVENOUS | Status: DC

## 2022-08-14 NOTE — Progress Notes (Signed)
[facility] History and Physical   Primary Care Physician:  Arvella Nigh, MD   Reason for Procedure:   Hx colon adenoma  Plan:    colonoscopy     HPI: Diane Stewart is a 56 y.o. female s/p removal of 2 mm adenoma 2016   Past Medical History:  Diagnosis Date   Allergy    seasonal, dust mites   Anxiety    Depression    Hx of adenomatous polyp of colon 05/27/2015    Past Surgical History:  Procedure Laterality Date   CESAREAN SECTION     x2   COLONOSCOPY     DILATION AND CURETTAGE OF UTERUS      Prior to Admission medications   Medication Sig Start Date End Date Taking? Authorizing Provider  Cholecalciferol (VITAMIN D3) 1000 units CAPS Vitamin D3   Yes [provider]  clobetasol (TEMOVATE) 0.05 % external solution Apply 1 Application topically as needed.   Yes [provider]  clobetasol (TEMOVATE) 0.05 % external solution Apply to scalp as directed daily as needed 06/10/22  Yes   calcium carbonate (TUMS EX) 750 MG chewable tablet Chew 1 tablet by mouth as needed for heartburn.    [provider]  ibuprofen (ADVIL) 200 MG tablet Take 200 mg by mouth as needed. Patient not taking: Reported on 08/14/2022    [provider]  triamcinolone (KENALOG) 0.025 % cream  11/19/21   [provider]    Current Outpatient Medications  Medication Sig Dispense Refill   Cholecalciferol (VITAMIN D3) 1000 units CAPS Vitamin D3     clobetasol (TEMOVATE) 0.05 % external solution Apply 1 Application topically as needed.     clobetasol (TEMOVATE) 0.05 % external solution Apply to scalp as directed daily as needed 50 mL 2   calcium carbonate (TUMS EX) 750 MG chewable tablet Chew 1 tablet by mouth as needed for heartburn.     ibuprofen (ADVIL) 200 MG tablet Take 200 mg by mouth as needed. (Patient not taking: Reported on 08/14/2022)     triamcinolone (KENALOG) 0.025 % cream  (Patient not taking: Reported on 08/14/2022)     Current  Facility-Administered Medications  Medication Dose Route Frequency Provider Last Rate Last Admin   0.9 %  sodium chloride infusion  500 mL Intravenous Once Gatha Mayer, MD        Allergies as of 08/14/2022 - Review Complete 08/14/2022  Allergen Reaction Noted   Other  06/04/2022   Shellfish allergy Other (See Comments) 05/26/2022   Sulfites  06/04/2022    Family History  Problem Relation Age of Onset   Colon cancer Neg Hx    Colon polyps Neg Hx    Crohn's disease Neg Hx    Esophageal cancer Neg Hx    Rectal cancer Neg Hx    Stomach cancer Neg Hx    Ulcerative colitis Neg Hx     Social History   Socioeconomic History   Marital status: Married    Spouse name: Not on file   Number of children: Not on file   Years of education: Not on file   Highest education level: Not on file  Occupational History   Not on file  Tobacco Use   Smoking status: Never    Passive exposure: Past   Smokeless tobacco: Never  Vaping Use   Vaping Use: Never used  Substance and Sexual Activity   Alcohol use: No    Alcohol/week: 0.0 standard drinks of alcohol  Drug use: No   Sexual activity: Not on file  Other Topics Concern   Not on file  Social History Narrative   Not on file   Social Determinants of Health   Financial Resource Strain: Not on file  Food Insecurity: Not on file  Transportation Needs: Not on file  Physical Activity: Not on file  Stress: Not on file  Social Connections: Not on file  Intimate Partner Violence: Not on file    Review of Systems:  All other review of systems negative except as mentioned in the HPI.  Physical Exam: Vital signs BP 121/66   Pulse 94   Temp 98.6 F (37 C) (Skin)   Ht 5' 7.5" (1.715 m)   Wt 132 lb 3.2 oz (60 kg)   SpO2 100%   BMI 20.40 kg/m   General:   Alert,  Well-developed, well-nourished, pleasant and cooperative in NAD Lungs:  Clear throughout to auscultation.   Heart:  Regular rate and rhythm; no murmurs, clicks, rubs,   or gallops. Abdomen:  Soft, nontender and nondistended. Normal bowel sounds.   Neuro/Psych:  Alert and cooperative. Normal mood and affect. A and O x 3   '@Ariatna Jester'$  Simonne Maffucci, MD, [facility] [phone] (pager) 08/14/2022 8:30 AM@

## 2022-08-14 NOTE — Op Note (Signed)
[facility] Patient Name: Diane Stewart Procedure Date: 08/14/2022 8:23 AM MRN: [id number] Endoscopist: Gatha Mayer , MD, [id number] Age: 56 Referring MD:  Date of Birth: Oct 02, 1965 Gender: Female Account #: [id number] Procedure:                Colonoscopy Indications:              Surveillance: Personal history of adenomatous                            polyps on last colonoscopy > 5 years ago, Last                            colonoscopy: 2016 Medicines:                Monitored Anesthesia Care Procedure:                Pre-Anesthesia Assessment:                           - Prior to the procedure, a History and Physical                            was performed, and patient medications and                            allergies were reviewed. The patient's tolerance of                            previous anesthesia was also reviewed. The risks                            and benefits of the procedure and the sedation                            options and risks were discussed with the patient.                            All questions were answered, and informed consent                            was obtained. Prior Anticoagulants: The patient has                            taken no anticoagulant or antiplatelet agents. ASA                            Grade Assessment: I - A normal, healthy patient.                            After reviewing the risks and benefits, the patient                            was deemed in satisfactory condition to undergo the  procedure.                           After obtaining informed consent, the colonoscope                            was passed under direct vision. Throughout the                            procedure, the patient's blood pressure, pulse, and                            oxygen saturations were monitored continuously. The                            Olympus [id number]-[id number] ([id number]) Colonoscope was                             introduced through the anus and advanced to the the                            cecum, identified by appendiceal orifice and                            ileocecal valve. The colonoscopy was performed                            without difficulty. The patient tolerated the                            procedure well. The quality of the bowel                            preparation was excellent. The bowel preparation                            used was Miralax via split dose instruction. The                            ileocecal valve, appendiceal orifice, and rectum                            were photographed. Scope In: 8:39:45 AM Scope Out: 8:59:28 AM Scope Withdrawal Time: 0 hours 14 minutes 56 seconds  Total Procedure Duration: 0 hours 19 minutes 43 seconds  Findings:                 The perianal and digital rectal examinations were                            normal.                           A 3 mm polyp was found in the distal sigmoid colon.  The polyp was sessile. The polyp was removed with a                            cold snare. Resection and retrieval were complete.                            Verification of patient identification for the                            specimen was done. Estimated blood loss was minimal.                           The exam was otherwise without abnormality on                            direct and retroflexion views. Complications:            No immediate complications. Estimated Blood Loss:     Estimated blood loss was minimal. Impression:               - One 3 mm polyp in the distal sigmoid colon,                            removed with a cold snare. Resected and retrieved.                           - The examination was otherwise normal on direct                            and retroflexion views.                           - Personal history of colonic polyp 2 mm adenoma                            removed  2016. Recommendation:           - Patient has a contact number available for                            emergencies. The signs and symptoms of potential                            delayed complications were discussed with the                            patient. Return to normal activities tomorrow.                            Written discharge instructions were provided to the                            patient.                           -  Resume previous diet.                           - Continue present medications.                           - Repeat colonoscopy is recommended for                            surveillance. The colonoscopy date will be                            determined after pathology results from today's                            exam become available for review. Gatha Mayer, MD 08/14/2022 9:07:24 AM This report has been signed electronically.

## 2022-08-14 NOTE — Progress Notes (Signed)
Pt resting comfortably. VSS. Airway intact. SBAR complete to RN. All questions answered.   

## 2022-08-14 NOTE — Patient Instructions (Addendum)
I found and removed another tiny polyp - about 3 mm in size. All else ok.  I will let you know pathology results and when to have another routine colonoscopy by mail and/or My Chart. Guidelines continue to evolve and could possibly wait 10 years for next one, pending pathology review.  Please resume regular diet and medications.  I appreciate the opportunity to care for you. Gatha Mayer, MD, [facility]  Handout on polyps.   YOU HAD AN ENDOSCOPIC PROCEDURE TODAY AT [facility]:   Refer to the procedure report that was given to you for any specific questions about what was found during the examination.  If the procedure report does not answer your questions, please call your gastroenterologist to clarify.  If you requested that your care partner not be given the details of your procedure findings, then the procedure report has been included in a sealed envelope for you to review at your convenience later.  YOU SHOULD EXPECT: Some feelings of bloating in the abdomen. Passage of more gas than usual.  Walking can help get rid of the air that was put into your GI tract during the procedure and reduce the bloating. If you had a lower endoscopy (such as a colonoscopy or flexible sigmoidoscopy) you may notice spotting of blood in your stool or on the toilet paper. If you underwent a bowel prep for your procedure, you may not have a normal bowel movement for a few days.  Please Note:  You might notice some irritation and congestion in your nose or some drainage.  This is from the oxygen used during your procedure.  There is no need for concern and it should clear up in a day or so.  SYMPTOMS TO REPORT IMMEDIATELY:  Following lower endoscopy (colonoscopy or flexible sigmoidoscopy):  Excessive amounts of blood in the stool  Significant tenderness or worsening of abdominal pains  Swelling of the abdomen that is new, acute  Fever of 100F or higher   For urgent or emergent issues, a  gastroenterologist can be reached at any hour by calling [phone]. Do not use MyChart messaging for urgent concerns.    DIET:  We do recommend a small meal at first, but then you may proceed to your regular diet.  Drink plenty of fluids but you should avoid alcoholic beverages for 24 hours.  ACTIVITY:  You should plan to take it easy for the rest of today and you should NOT DRIVE or use heavy machinery until tomorrow (because of the sedation medicines used during the test).    FOLLOW UP: Our staff will call the number listed on your records the next business day following your procedure.  We will call around 7:15- 8:00 am to check on you and address any questions or concerns that you may have regarding the information given to you following your procedure. If we do not reach you, we will leave a message.     If any biopsies were taken you will be contacted by phone or by letter within the next 1-3 weeks.  Please call us at [phone] if you have not heard about the biopsies in 3 weeks.    SIGNATURES/CONFIDENTIALITY: You and/or your care partner have signed paperwork which will be entered into your electronic medical record.  These signatures attest to the fact that that the information above on your After Visit Summary has been reviewed and is understood.  Full responsibility of the confidentiality of this discharge information lies  with you and/or your care-partner.

## 2022-08-14 NOTE — Progress Notes (Signed)
Called to room to assist during endoscopic procedure.  Patient ID and intended procedure confirmed with present staff. Received instructions for my participation in the procedure from the performing physician.  

## 2022-08-15 ENCOUNTER — Telehealth: Payer: Self-pay | Admitting: *Deleted

## 2022-08-15 NOTE — Telephone Encounter (Signed)
  Follow up Call-     08/14/2022    7:33 AM  Call back number  Post procedure Call Back phone  # [phone]  Permission to leave phone message Yes     Patient questions:  Do you have a fever, pain , or abdominal swelling? No. Pain Score  0 *  Have you tolerated food without any problems? Yes.    Have you been able to return to your normal activities? Yes.    Do you have any questions about your discharge instructions: Diet   No. Medications  No. Follow up visit  No.  Do you have questions or concerns about your Care? No.  Actions: * If pain score is 4 or above: No action needed, pain <4.

## 2022-08-24 ENCOUNTER — Encounter: Payer: Self-pay | Admitting: Internal Medicine

## 2022-08-24 DIAGNOSIS — Z86010 Personal history of colonic polyps: Secondary | ICD-10-CM

## 2022-09-16 ENCOUNTER — Telehealth: Payer: Self-pay | Admitting: Internal Medicine

## 2022-09-16 NOTE — Telephone Encounter (Signed)
Patient called stating she still has not received her pathology results in the mail, nor has she had a phone call regarding the results.  Please mail patient results as well as call her and advise.  Thank you.

## 2022-09-16 NOTE — Telephone Encounter (Signed)
Pt made aware of recent letter and results:  Letter sent to pt via mail: Pt made aware: Pt verbalized understanding with all questions answered.

## 2023-02-17 ENCOUNTER — Other Ambulatory Visit: Payer: Self-pay | Admitting: Obstetrics and Gynecology

## 2023-02-17 DIAGNOSIS — Z1231 Encounter for screening mammogram for malignant neoplasm of breast: Secondary | ICD-10-CM

## 2023-02-23 ENCOUNTER — Other Ambulatory Visit (HOSPITAL_COMMUNITY): Payer: Self-pay

## 2023-02-23 MED ORDER — TRIAMCINOLONE ACETONIDE 0.025 % EX CREA
TOPICAL_CREAM | CUTANEOUS | 0 refills | Status: DC
  Filled 2023-02-23: qty 80, 30d supply, fill #0

## 2023-02-24 ENCOUNTER — Other Ambulatory Visit (HOSPITAL_COMMUNITY): Payer: Self-pay

## 2023-02-25 ENCOUNTER — Other Ambulatory Visit (HOSPITAL_COMMUNITY): Payer: Self-pay

## 2023-03-30 ENCOUNTER — Other Ambulatory Visit (HOSPITAL_COMMUNITY): Payer: Self-pay

## 2023-03-30 MED ORDER — CLINDAMYCIN PHOSPHATE 1 % EX LOTN
TOPICAL_LOTION | CUTANEOUS | 2 refills | Status: AC
  Filled 2023-03-30: qty 60, 30d supply, fill #0

## 2023-04-07 ENCOUNTER — Ambulatory Visit
Admission: RE | Admit: 2023-04-07 | Discharge: 2023-04-07 | Disposition: A | Discharge status: Home / Self Care | Payer: 59 | Source: Ambulatory Visit | Attending: Obstetrics and Gynecology | Admitting: Obstetrics and Gynecology

## 2023-04-07 DIAGNOSIS — Z1231 Encounter for screening mammogram for malignant neoplasm of breast: Secondary | ICD-10-CM

## 2023-12-03 ENCOUNTER — Other Ambulatory Visit (HOSPITAL_BASED_OUTPATIENT_CLINIC_OR_DEPARTMENT_OTHER): Payer: Self-pay | Admitting: Obstetrics and Gynecology

## 2023-12-03 DIAGNOSIS — Z8249 Family history of ischemic heart disease and other diseases of the circulatory system: Secondary | ICD-10-CM

## 2023-12-03 DIAGNOSIS — Z124 Encounter for screening for malignant neoplasm of cervix: Secondary | ICD-10-CM | POA: Diagnosis not present

## 2023-12-03 DIAGNOSIS — Z13820 Encounter for screening for osteoporosis: Secondary | ICD-10-CM | POA: Diagnosis not present

## 2023-12-03 DIAGNOSIS — Z01419 Encounter for gynecological examination (general) (routine) without abnormal findings: Secondary | ICD-10-CM | POA: Diagnosis not present

## 2023-12-03 DIAGNOSIS — Z681 Body mass index (BMI) 19 or less, adult: Secondary | ICD-10-CM | POA: Diagnosis not present

## 2023-12-03 DIAGNOSIS — Z1151 Encounter for screening for human papillomavirus (HPV): Secondary | ICD-10-CM | POA: Diagnosis not present

## 2023-12-07 ENCOUNTER — Ambulatory Visit (HOSPITAL_COMMUNITY)
Admission: RE | Admit: 2023-12-07 | Discharge: 2023-12-07 | Disposition: A | Discharge status: Home / Self Care | Payer: Self-pay | Source: Ambulatory Visit | Attending: Obstetrics and Gynecology | Admitting: Obstetrics and Gynecology

## 2023-12-07 DIAGNOSIS — Z8249 Family history of ischemic heart disease and other diseases of the circulatory system: Secondary | ICD-10-CM | POA: Insufficient documentation

## 2023-12-18 DIAGNOSIS — Z1321 Encounter for screening for nutritional disorder: Secondary | ICD-10-CM | POA: Diagnosis not present

## 2023-12-18 DIAGNOSIS — Z13228 Encounter for screening for other metabolic disorders: Secondary | ICD-10-CM | POA: Diagnosis not present

## 2023-12-18 DIAGNOSIS — Z131 Encounter for screening for diabetes mellitus: Secondary | ICD-10-CM | POA: Diagnosis not present

## 2023-12-18 DIAGNOSIS — Z13220 Encounter for screening for lipoid disorders: Secondary | ICD-10-CM | POA: Diagnosis not present

## 2023-12-18 DIAGNOSIS — Z1329 Encounter for screening for other suspected endocrine disorder: Secondary | ICD-10-CM | POA: Diagnosis not present

## 2024-02-10 ENCOUNTER — Other Ambulatory Visit (HOSPITAL_COMMUNITY): Payer: Self-pay

## 2024-02-10 DIAGNOSIS — L570 Actinic keratosis: Secondary | ICD-10-CM | POA: Diagnosis not present

## 2024-02-10 MED ORDER — IMIQUIMOD 5 % EX CREA
TOPICAL_CREAM | CUTANEOUS | 0 refills | Status: DC
  Filled 2024-02-10: qty 8, 14d supply, fill #0

## 2024-02-11 ENCOUNTER — Other Ambulatory Visit (HOSPITAL_COMMUNITY): Payer: Self-pay

## 2024-03-07 ENCOUNTER — Other Ambulatory Visit: Payer: Self-pay | Admitting: Obstetrics and Gynecology

## 2024-03-07 DIAGNOSIS — Z1231 Encounter for screening mammogram for malignant neoplasm of breast: Secondary | ICD-10-CM

## 2024-03-09 DIAGNOSIS — H2513 Age-related nuclear cataract, bilateral: Secondary | ICD-10-CM | POA: Diagnosis not present

## 2024-04-08 ENCOUNTER — Ambulatory Visit
Admission: RE | Admit: 2024-04-08 | Discharge: 2024-04-08 | Disposition: A | Discharge status: Home / Self Care | Source: Ambulatory Visit | Attending: Obstetrics and Gynecology | Admitting: Obstetrics and Gynecology

## 2024-04-08 DIAGNOSIS — Z1231 Encounter for screening mammogram for malignant neoplasm of breast: Secondary | ICD-10-CM | POA: Diagnosis not present

## 2024-04-22 ENCOUNTER — Ambulatory Visit (HOSPITAL_BASED_OUTPATIENT_CLINIC_OR_DEPARTMENT_OTHER): Admitting: Cardiology

## 2024-04-22 ENCOUNTER — Other Ambulatory Visit (HOSPITAL_COMMUNITY): Payer: Self-pay

## 2024-04-22 ENCOUNTER — Encounter (HOSPITAL_BASED_OUTPATIENT_CLINIC_OR_DEPARTMENT_OTHER): Payer: Self-pay | Admitting: Cardiology

## 2024-04-22 VITALS — BP 116/82 | HR 90 | Ht 68.0 in | Wt 124.6 lb

## 2024-04-22 DIAGNOSIS — Z712 Person consulting for explanation of examination or test findings: Secondary | ICD-10-CM | POA: Diagnosis not present

## 2024-04-22 DIAGNOSIS — Z7189 Other specified counseling: Secondary | ICD-10-CM

## 2024-04-22 DIAGNOSIS — Z8249 Family history of ischemic heart disease and other diseases of the circulatory system: Secondary | ICD-10-CM | POA: Diagnosis not present

## 2024-04-22 DIAGNOSIS — Z5181 Encounter for therapeutic drug level monitoring: Secondary | ICD-10-CM | POA: Diagnosis not present

## 2024-04-22 DIAGNOSIS — I2510 Atherosclerotic heart disease of native coronary artery without angina pectoris: Secondary | ICD-10-CM

## 2024-04-22 MED ORDER — ROSUVASTATIN CALCIUM 5 MG PO TABS
5.0000 mg | ORAL_TABLET | ORAL | 3 refills | Status: DC
  Filled 2024-04-22: qty 12, 28d supply, fill #0
  Filled 2024-05-17: qty 12, 28d supply, fill #1
  Filled 2024-06-13: qty 12, 28d supply, fill #2
  Filled 2024-07-11: qty 12, 28d supply, fill #3

## 2024-04-22 MED ORDER — ASPIRIN 81 MG PO TBEC: 81.0000 mg | DELAYED_RELEASE_TABLET | Freq: Every day | ORAL | Status: AC

## 2024-04-22 NOTE — Patient Instructions (Signed)
 Medication Instructions:  START ROSUVASTATIN  5 MG 3 TIMES A WEEK   START ASPIRIN  81 MG DAILY   Labwork: FASTING LP/CMET IN 2 MONTHS   Testing/Procedures: NONE  Follow-Up: AS NEEDED

## 2024-04-22 NOTE — Progress Notes (Signed)
 Cardiology Office Note:  .   Date:  04/22/2024  ID:  Diane Stewart, DOB 1966-01-12, MRN [id number] PCP: Leva Rush, MD  [facility] Providers Cardiologist:  Shelda Bruckner, MD {  History of Present Illness: Diane Stewart is a 58 y.o. female with family history of heart disease referred by Dr. McComb for new patient consultation for abnormal calcium  score.  Today: Referral notes from Dr. Leva reviewed. At her visit 12/03/23, she was recommended for calcium  score given her family history. This showed Ca score 47.1 (85th %ile), and she was referred to cardiology for further evaluation.  Cardiovascular risk factors: Prior clinical ASCVD: none  Comorbid conditions: Denies hypertension, hyperlipidemia, diabetes, chronic kidney disease  Metabolic syndrome/Obesity: current weight 124, BMI ~19 Chronic inflammatory conditions: none Tobacco use history: never Family history: Diane Stewart had bypass surgery around late 29s in age, live to age 60. Father's history is unclear, living age 31, was recommended for statin at one time but unclear if he took this. Unclear etiology of syncope history, just had loop recorder inserted. Mother has afib. Diane Stewart had multiple strokes and Diane Stewart had TIAs. Prior pertinent testing and/or incidental findings: reviewed calcium  score together, see below  ROS: Denies chest pain, shortness of breath at rest or with normal exertion. No PND, orthopnea, LE edema or unexpected weight gain. No syncope or palpitations. ROS otherwise negative except as noted.   Studies Reviewed: SABRA    EKG:  EKG Interpretation Date/Time:  Friday April 22 2024 10:03:18 EDT Ventricular Rate:  83 PR Interval:  154 QRS Duration:  70 QT Interval:  360 QTC Calculation: 423 R Axis:   91  Text Interpretation: Sinus rhythm with marked sinus arrhythmia Confirmed by Bruckner Shelda [phone]) on 04/22/2024 10:39:01 AM    Physical Exam:   VS:  BP 116/82   Pulse 90   Ht  5' 8 (1.727 m)   Wt 124 lb 9.6 oz (56.5 kg)   SpO2 97%   BMI 18.95 kg/m    Wt Readings from Last 3 Encounters:  04/22/24 124 lb 9.6 oz (56.5 kg)  08/14/22 132 lb 3.2 oz (60 kg)  06/04/22 132 lb 3.2 oz (60 kg)    GEN: Well nourished, well developed in no acute distress HEENT: Normal, moist mucous membranes NECK: No JVD CARDIAC: regular rhythm, normal S1 and S2, no rubs or gallops. No murmur. VASCULAR: Radial and DP pulses 2+ bilaterally. No carotid bruits RESPIRATORY:  Clear to auscultation without rales, wheezing or rhonchi  ABDOMEN: Soft, non-tender, non-distended MUSCULOSKELETAL:  Ambulates independently SKIN: Warm and dry, no edema NEUROLOGIC:  Alert and oriented x 3. No focal neuro deficits noted. PSYCHIATRIC:  Normal affect    ASSESSMENT AND PLAN: .    Coronary calcification, consistent with nonobstructive CAD Family history of heart disease -Lipids from [facility] 12/2023: Tchol 186, HDL 47, LDL 135, TG 91 -We reviewed the calcium  score at length, including images as well as the graph showing mortality based on calcium  score. We discussed the pathophysiology of cholesterol plaque formation, the role of calcium  and why it is a marker, how plaque is key to acute MI/CVA, and how known plaque is managed with medications.   -we discussed the data on statins, both in terms of their long term benefit as well as the risk of side effects. Reviewed common misconceptions about statins. Reviewed how we monitor treatment. After shared decision making, she is nervous but willing to try low dose/spread dose to  see if she tolerates. -discussed guidelines recommending aspirin . After shared decision making, patient is amenable. Discussed watching for signs of bleeding -reviewed red flag warning signs that need immediate medical attention   CV risk counseling and prevention -recommend heart healthy/Mediterranean diet, with whole grains, fruits, vegetable, fish, lean meats, nuts, and olive oil. Limit  salt. -recommend moderate walking, 3-5 times/week for 30-50 minutes each session. Aim for at least 150 minutes.week. Goal should be pace of 3 miles/hours, or walking 1.5 miles in 30 minutes -recommend avoidance of tobacco products. Avoid excess alcohol.  Dispo: I would be happy to see her back as needed  Signed, Shelda Bruckner, MD   Shelda Bruckner, MD, PhD, [facility]  [facility]  [place]  [facility] at [facility] [address] [place], [place] [address] [phone]

## 2024-06-22 DIAGNOSIS — D1801 Hemangioma of skin and subcutaneous tissue: Secondary | ICD-10-CM | POA: Diagnosis not present

## 2024-06-22 DIAGNOSIS — L738 Other specified follicular disorders: Secondary | ICD-10-CM | POA: Diagnosis not present

## 2024-06-22 DIAGNOSIS — D2371 Other benign neoplasm of skin of right lower limb, including hip: Secondary | ICD-10-CM | POA: Diagnosis not present

## 2024-06-22 DIAGNOSIS — L814 Other melanin hyperpigmentation: Secondary | ICD-10-CM | POA: Diagnosis not present

## 2024-06-22 DIAGNOSIS — L821 Other seborrheic keratosis: Secondary | ICD-10-CM | POA: Diagnosis not present

## 2024-06-22 DIAGNOSIS — D2261 Melanocytic nevi of right upper limb, including shoulder: Secondary | ICD-10-CM | POA: Diagnosis not present

## 2024-06-22 DIAGNOSIS — D485 Neoplasm of uncertain behavior of skin: Secondary | ICD-10-CM | POA: Diagnosis not present

## 2024-06-22 DIAGNOSIS — D2262 Melanocytic nevi of left upper limb, including shoulder: Secondary | ICD-10-CM | POA: Diagnosis not present

## 2024-06-22 DIAGNOSIS — D2272 Melanocytic nevi of left lower limb, including hip: Secondary | ICD-10-CM | POA: Diagnosis not present

## 2024-06-22 DIAGNOSIS — D2271 Melanocytic nevi of right lower limb, including hip: Secondary | ICD-10-CM | POA: Diagnosis not present

## 2024-06-28 ENCOUNTER — Other Ambulatory Visit (HOSPITAL_COMMUNITY): Payer: Self-pay

## 2024-06-28 MED ORDER — IMIQUIMOD 5 % EX CREA
TOPICAL_CREAM | CUTANEOUS | 0 refills | Status: AC
  Filled 2024-06-28: qty 8, 8d supply, fill #0

## 2024-06-30 DIAGNOSIS — I2510 Atherosclerotic heart disease of native coronary artery without angina pectoris: Secondary | ICD-10-CM | POA: Diagnosis not present

## 2024-06-30 DIAGNOSIS — Z8249 Family history of ischemic heart disease and other diseases of the circulatory system: Secondary | ICD-10-CM | POA: Diagnosis not present

## 2024-06-30 DIAGNOSIS — Z5181 Encounter for therapeutic drug level monitoring: Secondary | ICD-10-CM | POA: Diagnosis not present

## 2024-07-01 LAB — HEPATIC FUNCTION PANEL
ALT: 24 IU/L (ref 0–32)
AST: 26 IU/L (ref 0–40)
Albumin: 4.5 g/dL (ref 3.8–4.9)
Alkaline Phosphatase: 68 IU/L (ref 49–135)
Bilirubin Total: 0.8 mg/dL (ref 0.0–1.2)
Bilirubin, Direct: 0.21 mg/dL (ref 0.00–0.40)
Total Protein: 7.1 g/dL (ref 6.0–8.5)

## 2024-07-01 LAB — LIPID PANEL
Chol/HDL Ratio: 3.4 ratio (ref 0.0–4.4)
Cholesterol, Total: 180 mg/dL (ref 100–199)
HDL: 53 mg/dL (ref >39)
LDL Chol Calc (NIH): 113 mg/dL — ABNORMAL HIGH (ref 0–99)
Triglycerides: 75 mg/dL (ref 0–149)
VLDL Cholesterol Cal: 14 mg/dL (ref 5–40)

## 2024-07-12 ENCOUNTER — Ambulatory Visit (HOSPITAL_BASED_OUTPATIENT_CLINIC_OR_DEPARTMENT_OTHER): Payer: Self-pay | Admitting: Cardiology

## 2024-07-12 DIAGNOSIS — I2510 Atherosclerotic heart disease of native coronary artery without angina pectoris: Secondary | ICD-10-CM

## 2024-07-12 DIAGNOSIS — Z5181 Encounter for therapeutic drug level monitoring: Secondary | ICD-10-CM

## 2024-07-13 ENCOUNTER — Other Ambulatory Visit (HOSPITAL_COMMUNITY): Payer: Self-pay

## 2024-07-13 ENCOUNTER — Other Ambulatory Visit: Payer: Self-pay

## 2024-07-13 MED ORDER — ROSUVASTATIN CALCIUM 5 MG PO TABS
5.0000 mg | ORAL_TABLET | Freq: Every day | ORAL | 3 refills | Status: AC
  Filled 2024-07-13: qty 30, 30d supply, fill #0
  Filled 2024-07-13: qty 90, 90d supply, fill #0
  Filled 2024-08-09: qty 30, 30d supply, fill #1
  Filled 2024-09-10: qty 30, 30d supply, fill #2
  Filled 2024-10-11: qty 30, 30d supply, fill #3

## 2024-08-09 ENCOUNTER — Other Ambulatory Visit: Payer: Self-pay

## 2024-09-14 LAB — LIPID PANEL
Chol/HDL Ratio: 3 ratio (ref 0.0–4.4)
Cholesterol, Total: 160 mg/dL (ref 100–199)
HDL: 53 mg/dL
LDL Chol Calc (NIH): 94 mg/dL (ref 0–99)
Triglycerides: 67 mg/dL (ref 0–149)
VLDL Cholesterol Cal: 13 mg/dL (ref 5–40)

## 2024-09-14 LAB — HEPATIC FUNCTION PANEL
ALT: 31 IU/L (ref 0–32)
AST: 28 IU/L (ref 0–40)
Albumin: 4.6 g/dL (ref 3.8–4.9)
Alkaline Phosphatase: 69 IU/L (ref 49–135)
Bilirubin Total: 0.8 mg/dL (ref 0.0–1.2)
Bilirubin, Direct: 0.21 mg/dL (ref 0.00–0.40)
Total Protein: 7 g/dL (ref 6.0–8.5)

## 2024-10-12 ENCOUNTER — Other Ambulatory Visit (HOSPITAL_COMMUNITY): Payer: Self-pay
